# Patient Record
Sex: Male | Born: 2000
Health system: Southern US, Community
[De-identification: ages and names within clinical notes are randomized; demographics above are authoritative.]

## PROBLEM LIST (undated history)

## (undated) DIAGNOSIS — F419 Anxiety disorder, unspecified: Secondary | ICD-10-CM

## (undated) DIAGNOSIS — F429 Obsessive-compulsive disorder, unspecified: Secondary | ICD-10-CM

## (undated) DIAGNOSIS — E039 Hypothyroidism, unspecified: Secondary | ICD-10-CM

## (undated) DIAGNOSIS — S62109A Fracture of unspecified carpal bone, unspecified wrist, initial encounter for closed fracture: Secondary | ICD-10-CM

## (undated) DIAGNOSIS — S0993XA Unspecified injury of face, initial encounter: Secondary | ICD-10-CM

## (undated) HISTORY — PX: CIRCUMCISION: SUR203

## (undated) HISTORY — PX: WISDOM TOOTH EXTRACTION: SHX21

---
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
## 2019-02-18 ENCOUNTER — Encounter (INDEPENDENT_AMBULATORY_CARE_PROVIDER_SITE_OTHER): Payer: Self-pay | Admitting: Family

## 2019-02-25 ENCOUNTER — Telehealth (INDEPENDENT_AMBULATORY_CARE_PROVIDER_SITE_OTHER): Payer: Self-pay | Admitting: Family

## 2019-02-25 ENCOUNTER — Other Ambulatory Visit (INDEPENDENT_AMBULATORY_CARE_PROVIDER_SITE_OTHER): Payer: Self-pay | Admitting: Family

## 2019-02-25 DIAGNOSIS — E063 Autoimmune thyroiditis: Secondary | ICD-10-CM

## 2019-02-25 NOTE — Telephone Encounter (Signed)
Who's calling (name and relationship to patient) : Consuella Lose (mom)  Best contact number: [phone]  Provider they see: Hermenia Bers   Reason for call:  Mom called in requesting labs be put in for Saleh prior to his appt with Spenser. Mom is also wondering if Spenser can order Domanique a COVID antibodies test (?)  Call ID:      PRESCRIPTION REFILL ONLY  Name of prescription:  Pharmacy:

## 2019-02-25 NOTE — Telephone Encounter (Signed)
LVM, Labs are in the portal.

## 2019-02-25 NOTE — Telephone Encounter (Signed)
Can u and will u order the antibody test?

## 2019-02-25 NOTE — Telephone Encounter (Signed)
Orders placed. COVID antibody test and TFTS included.

## 2019-03-19 IMAGING — DX DG BONE AGE
1 series · 1 of 1 positions shown · non-contrast
Comparison: None.

CLINICAL DATA: Delayed bone age.

EXAM:
BONE AGE DETERMINATION
TECHNIQUE: AP radiographs of the hand and wrist are correlated with the
developmental standards of Greulich and Pyle.

[dg bone age]
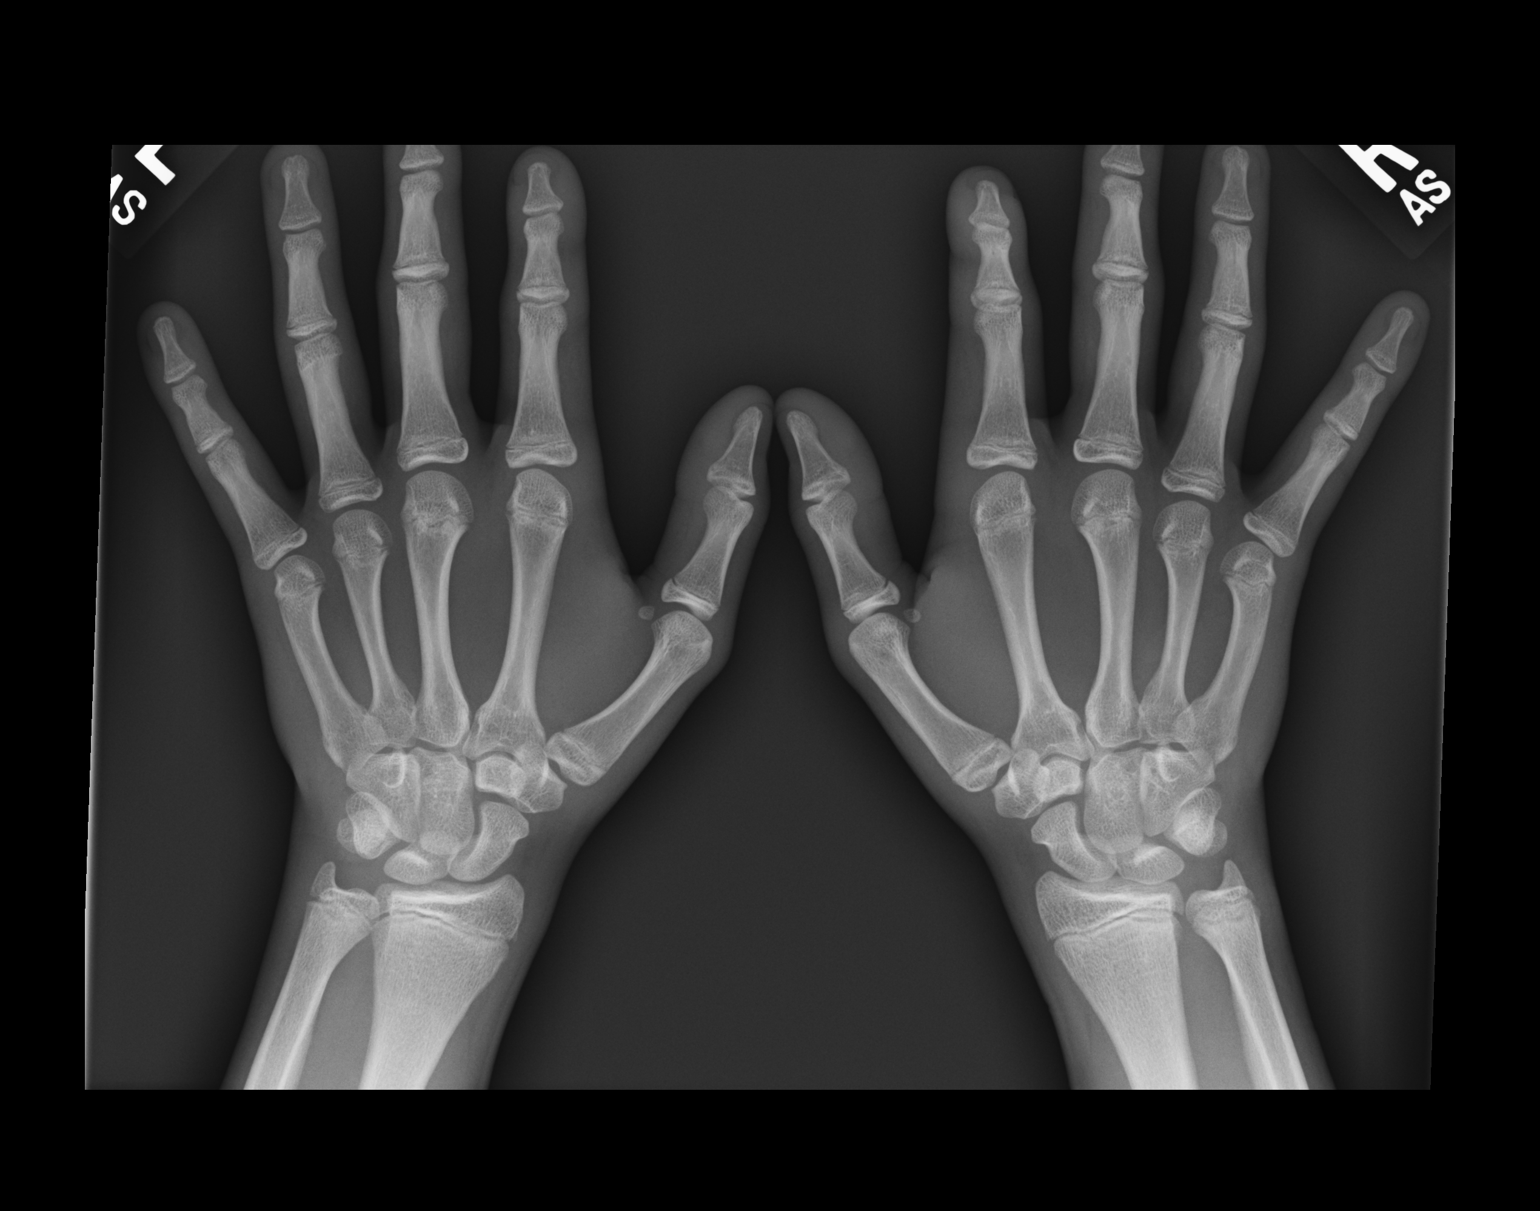

[1 of 1 positions shown; findings below may reference images not displayed]

FINDINGS: Chronologic age: 16 Years 2 months (date of birth 08/03/2000<Patient
Birth Date>08/03/2000)

Bone age:  14  Years 0 months.
IMPRESSION: Bone age is 13 years 0 months.

## 2019-03-27 DIAGNOSIS — E063 Autoimmune thyroiditis: Secondary | ICD-10-CM | POA: Diagnosis not present

## 2019-03-28 LAB — T4, FREE: Free T4: 1 ng/dL (ref 0.8–1.4)

## 2019-03-28 LAB — TSH: TSH: 1.24 mIU/L (ref 0.50–4.30)

## 2019-03-28 LAB — SAR COV2 SEROLOGY (COVID19)AB(IGG),IA: SARS CoV2 AB IGG: NEGATIVE

## 2019-03-31 ENCOUNTER — Telehealth (INDEPENDENT_AMBULATORY_CARE_PROVIDER_SITE_OTHER): Payer: Self-pay

## 2019-03-31 NOTE — Telephone Encounter (Signed)
-----   Message from Hermenia Bers, NP sent at 03/30/2019  8:23 AM EST ----- COVID antibody testing is negative. Thyroid labs are normal.

## 2019-03-31 NOTE — Telephone Encounter (Signed)
Called and relayed results. No questions were asked at this time.

## 2019-04-14 ENCOUNTER — Encounter (INDEPENDENT_AMBULATORY_CARE_PROVIDER_SITE_OTHER): Payer: Self-pay | Admitting: Family

## 2019-04-14 ENCOUNTER — Ambulatory Visit (INDEPENDENT_AMBULATORY_CARE_PROVIDER_SITE_OTHER): Payer: BC Managed Care – PPO | Admitting: Family

## 2019-04-14 ENCOUNTER — Other Ambulatory Visit: Payer: Self-pay

## 2019-04-14 VITALS — Wt 170.0 lb

## 2019-04-14 DIAGNOSIS — R6252 Short stature (child): Secondary | ICD-10-CM

## 2019-04-14 DIAGNOSIS — E063 Autoimmune thyroiditis: Secondary | ICD-10-CM

## 2019-04-14 NOTE — Patient Instructions (Signed)
Continue 37.5 mcg of levothyroxine  Repeat labs and follow up in 6 months.

## 2019-04-14 NOTE — Progress Notes (Signed)
This is a Pediatric Specialist E-Visit follow up consult provided via  WebEx Malaky Tetrault  consented to an E-Visit consult today.  Location of patient: Luis Robertson is at home Location of provider: Melissa Noon is at home Patient was referred by Monna Fam, MD   The following participants were involved in this E-Visit: Bethann Goo, RMA Hermenia Bers, Lawrence- Patient Chief Complain/ Reason for E-Visit today: Thyroid follow up Total time on call: This visit lasted >15 minutes. More then 50% of the visit was devoted to counseling.  Follow up: 6 months.      Subjective:  Subjective  Patient Name: Luis Robertson Date of Birth: 04-27-2000  MRN: [id number]  Manual Navarra  presents to the office today for initial evaluation and management of his short stature, delayed puberty, and hypothyroidism  HISTORY OF PRESENT ILLNESS:   Luis Robertson is a 18 y.o. Caucasian male   [name] was accompanied by his mother  1. [name] was seen by his PCP in May 2015 for his 18 year Luis Robertson. At that visit they discussed his short stature, poor linear growth, and delayed puberty start. He had a bone age which was read as 10 year at CA 18 years 2 months (film not available for review). He had labs which reportedly revealed hypothyroidism (no labs from PCP) and he was started on low dose Synthroid. He was referred to endocrinology for further evaluation and management.    2. Luis Robertson's last clinic visit was 09/2018. In the interim he has been generally healthy.  He has been staying active working out and cycling. 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 He is taking 37.5 mcg of levothyroxine per day. Denies missed doses. Takes at night usually. Denies fatigue, constipation and cold intolerance.   3. Pertinent Review of Systems:  All systems reviewed with pertinent positives listed below; otherwise negative. Constitutional:  Sleeping well. Energy is good.  HEENT: No vision changes. No neck pain. No trouble swallowing.  Respiratory: No increased work of breathing currently Cardiac. No tachycardia. No palpitations.  GI: No constipation or diarrhea GU: puberty changes as above Musculoskeletal: No joint deformity Neuro: Normal affect. No tremors.  Endocrine: As above   PAST MEDICAL, FAMILY, AND SOCIAL HISTORY  Past Medical History:  Diagnosis Date  . Anxiety   . Fracture of wrist 04/2014   left wrist at growth plate; is in cast 06/04/2014  . Hypothyroidism   . Lip injury 05/2014   upper lip; was hit in mouth by hockey stick  . OCD (obsessive compulsive disorder)     Family History  Problem Relation Age of Onset  . Hypertension Father      Current Outpatient Medications:  .  levothyroxine (SYNTHROID, LEVOTHROID) 75 MCG tablet, Take 0.5 tablets (37.5 mcg total) by mouth daily., Disp: 45 tablet, Rfl: 4 .  sertraline (ZOLOFT) 100 MG tablet, Take 200 mg by mouth daily. One two times a day, Disp: , Rfl:  .  naproxen sodium (ANAPROX) 220 MG tablet, Take 220 mg by mouth 2 (two) times daily with a meal. Reported on 10/18/2015, Disp: , Rfl:   Allergies as of 04/14/2019  . (No Known Allergies)     reports that he has never smoked. He has never used smokeless tobacco. He reports that he does not drink alcohol or use drugs. Pediatric History  Patient Parents  . Ashwath, Lasch (Mother)  . Hulsebus,[name] (Father)   Other Topics Concern  .  Not on file  Social History Narrative  . Not on file    1. School and Family: Freshman at [employer]  2. Activities: Cross Country/Track 3. Primary Care Provider: Aggie Hacker, MD  ROS: There are no other significant problems involving Luis Robertson's other body systems.    Objective:  Objective  Vital Signs:  Wt 170 lb (77.1 kg)   BMI 28.09 kg/m   Blood pressure percentiles are not available for patients who are 18 years or older.  Ht Readings from Last 3 Encounters:   10/13/18 5' 5.24" (1.657 m) (6 %, Z= -1.53)*  04/15/18 5' 5.28" (1.658 m) (7 %, Z= -1.49)*  09/10/17 5' 4.76" (1.645 m) (5 %, Z= -1.62)*   * Growth percentiles are based on CDC (Boys, 2-20 Years) data.   Wt Readings from Last 3 Encounters:  04/14/19 170 lb (77.1 kg) (71 %, Z= 0.56)*  10/13/18 167 lb 6.4 oz (75.9 kg) (70 %, Z= 0.53)*  04/15/18 153 lb (69.4 kg) (53 %, Z= 0.07)*   * Growth percentiles are based on CDC (Boys, 2-20 Years) data.   HC Readings from Last 3 Encounters:  No data found for [facility]   Body surface area is 1.88 meters squared. No height on file for this encounter. 71 %ile (Z= 0.56) based on CDC (Boys, 2-20 Years) weight-for-age data using vitals from 04/14/2019.    PHYSICAL EXAM:  General: Well developed, well nourished male in no acute distress.  Alert and oriented.  Head: Normocephalic, atraumatic.   Eyes:  Pupils equal and round. EOMI.  Sclera white.  No eye drainage.   Ears/Nose/Mouth/Throat: Nares patent, no nasal drainage.  Normal dentition, mucous membranes moist.  Neck: supple,  no thyromegaly Cardiovascular:No cyanosis.  Respiratory: No increased work of breathing.   Skin: warm, dry.  No rash or lesions. Neurologic: alert and oriented, normal speech, no tremor    Results for orders placed or performed in visit on 02/25/19  TSH  Result Value Ref Range   TSH 1.24 0.50 - 4.30 mIU/L  T4, free  Result Value Ref Range   Free T4 1.0 0.8 - 1.4 ng/dL  SAR CoV2 Serology (COVID 19)AB(IGG)IA  Result Value Ref Range   SARS CoV2 AB IGG NEGATIVE           Assessment and Plan:  Assessment  ASSESSMENT:   1. Short stature- Normal growth per report. Has completed puberty and is now at adult height.  2. Thyroid- He is euthyroid on 37.5 mcg of levothyroxine per day.    PLAN:  1. Diagnostic:TFTs as above. REpeat prior to next visit.  2. Therapeutic: 37.5 mcg of levothyroxine per day.  3. Patient education:Reviewed growth chart. Discussed s/s of  hypothyroidism. Encouraged to take medication every morning on empty stomach. Answered questions.  4. Follow-up: 6 months      Gretchen Short,  [facility]  Pediatric Specialist  [address]  [place], [address]  Tele: [phone]

## 2019-04-15 ENCOUNTER — Ambulatory Visit (INDEPENDENT_AMBULATORY_CARE_PROVIDER_SITE_OTHER): Payer: BC Managed Care – PPO | Admitting: Family

## 2019-04-27 ENCOUNTER — Other Ambulatory Visit (INDEPENDENT_AMBULATORY_CARE_PROVIDER_SITE_OTHER): Payer: Self-pay | Admitting: Family

## 2019-04-27 DIAGNOSIS — E063 Autoimmune thyroiditis: Secondary | ICD-10-CM

## 2019-09-17 ENCOUNTER — Other Ambulatory Visit (INDEPENDENT_AMBULATORY_CARE_PROVIDER_SITE_OTHER): Payer: Self-pay

## 2019-09-17 ENCOUNTER — Telehealth (INDEPENDENT_AMBULATORY_CARE_PROVIDER_SITE_OTHER): Payer: Self-pay | Admitting: Family

## 2019-09-17 DIAGNOSIS — E063 Autoimmune thyroiditis: Secondary | ICD-10-CM

## 2019-09-17 NOTE — Telephone Encounter (Signed)
  Who's calling (name and relationship to patient) : Erskine Squibb ( Mom)  Best contact number: [phone]  Provider they see: Gretchen Short  Reason for call: Patient is currently in [place]  mom wanted confirmation that it would be ok for patient to go to the [facility] there in [place] to have bloodwork done. They have been to the [facility] here a number of times for bloodwork. If so they would need the orders to be sent over for that testing to be done. She also wanted confirmation that a virtual appointment for the scheduled appointment on  10-15-19 was still going to be ok? Please call to confirm these two things. Thank You     PRESCRIPTION REFILL ONLY  Name of prescription:  Pharmacy:

## 2019-09-17 NOTE — Telephone Encounter (Signed)
Called mom. Left HIPPA compliant message in regards to virtual visit and lab orders being put in. Labs are in and [facility] should be able to pull them up no problem and a virtual should be ok.

## 2019-10-09 ENCOUNTER — Other Ambulatory Visit (INDEPENDENT_AMBULATORY_CARE_PROVIDER_SITE_OTHER): Payer: Self-pay

## 2019-10-09 DIAGNOSIS — E063 Autoimmune thyroiditis: Secondary | ICD-10-CM

## 2019-10-09 DIAGNOSIS — E039 Hypothyroidism, unspecified: Secondary | ICD-10-CM | POA: Insufficient documentation

## 2019-10-09 DIAGNOSIS — F429 Obsessive-compulsive disorder, unspecified: Secondary | ICD-10-CM | POA: Insufficient documentation

## 2019-10-09 DIAGNOSIS — R070 Pain in throat: Secondary | ICD-10-CM | POA: Diagnosis not present

## 2019-10-10 LAB — T4, FREE: Free T4: 1.14 ng/dL (ref 0.82–1.77)

## 2019-10-10 LAB — TSH: TSH: 0.93 u[IU]/mL (ref 0.450–4.500)

## 2019-10-12 ENCOUNTER — Encounter (INDEPENDENT_AMBULATORY_CARE_PROVIDER_SITE_OTHER): Payer: Self-pay

## 2019-10-15 ENCOUNTER — Telehealth (INDEPENDENT_AMBULATORY_CARE_PROVIDER_SITE_OTHER): Payer: BC Managed Care – PPO | Admitting: Family

## 2019-10-15 ENCOUNTER — Other Ambulatory Visit: Payer: Self-pay

## 2019-10-15 ENCOUNTER — Telehealth (INDEPENDENT_AMBULATORY_CARE_PROVIDER_SITE_OTHER): Payer: Self-pay | Admitting: Family

## 2019-10-15 ENCOUNTER — Encounter (INDEPENDENT_AMBULATORY_CARE_PROVIDER_SITE_OTHER): Payer: Self-pay | Admitting: Family

## 2019-10-15 DIAGNOSIS — E063 Autoimmune thyroiditis: Secondary | ICD-10-CM

## 2019-10-15 MED ORDER — LEVOTHYROXINE SODIUM 75 MCG PO TABS: 37.5000 ug | ORAL_TABLET | Freq: Every day | ORAL | 1 refills | Status: DC

## 2019-10-15 NOTE — Telephone Encounter (Signed)
°  Who's calling (name and relationship to patient) : Erskine Squibb (mom)  Best contact number: [phone]  Provider they see: Gretchen Short  Reason for call: Requests refill of thyroid medicine be sent to pharmacy.    PRESCRIPTION REFILL ONLY  Name of prescription: levothyroxine (SYNTHROID) 75 MCG tablet  [id number] [place], [place] - [address]

## 2019-10-15 NOTE — Patient Instructions (Signed)
Continue 37.5 mcg of levothyroxine per day  Labs and follow up in 6 months.

## 2019-10-15 NOTE — Progress Notes (Signed)
This is a Pediatric Specialist E-Visit follow up consult provided via WebEx Dakari Cregger  consented to an E-Visit consult today.  Location of patient: Aquan is at [employer] of provider: Melissa Noon  is at [facility] office.  (location) Patient was referred by Monna Fam, MD   The following participants were involved in this E-Visit: Luis Robertson   Chief Complain/ Reason for E-Visit today: Hypothyroidism  LOS: >20 spent today reviewing the medical chart, counseling the patient/family, and documenting today's visit.   Follow up: 6 months.      Subjective:  Subjective  Patient Name: Luis Robertson Date of Birth: 2000/12/01  MRN: [id number]  Luis Robertson  presents to the office today for initial evaluation and management of his short stature, delayed puberty, and hypothyroidism  HISTORY OF PRESENT ILLNESS:   Luis Robertson is a 19 y.o. Caucasian male   Kalief was accompanied by his mother  1. Luis Robertson was seen by his PCP in May 2015 for his 13 year [place]. At that visit they discussed his short stature, poor linear growth, and delayed puberty start. He had a bone age which was read as 10 year at [place] 13 years 2 months (film not available for review). He had labs which reportedly revealed hypothyroidism (no labs from PCP) and he was started on low dose Synthroid. He was referred to endocrinology for further evaluation and management.    2. Luis Robertson's last clinic visit was 03/2019. In the interim he has been generally healthy.  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 He feels like things have been going well overall. He is taking 37.5 mcg of levothyroxine per day, takes a night. He denies fatigue, constipation and cold intolerance.    3. Pertinent Review of Systems:  All systems reviewed with pertinent positives listed below; otherwise negative. Constitutional: Sleeping well. Energy is good.  HEENT: No vision changes. No neck  pain. No trouble swallowing.  Respiratory: No increased work of breathing currently Cardiac. No tachycardia. No palpitations.  GI: No constipation or diarrhea GU: puberty changes as above Musculoskeletal: No joint deformity Neuro: Normal affect. No tremors.  Endocrine: As above   PAST MEDICAL, FAMILY, AND SOCIAL HISTORY  Past Medical History:  Diagnosis Date  . Anxiety   . Fracture of wrist 04/2014   left wrist at growth plate; is in cast 06/04/2014  . Hypothyroidism   . Lip injury 05/2014   upper lip; was hit in mouth by hockey stick  . OCD (obsessive compulsive disorder)     Family History  Problem Relation Age of Onset  . Hypertension Father      Current Outpatient Medications:  .  levothyroxine (SYNTHROID) 75 MCG tablet, TAKE 0.5 TABLETS (37.5 MCG TOTAL) BY MOUTH DAILY., Disp: 45 tablet, Rfl: 1 .  naproxen sodium (ANAPROX) 220 MG tablet, Take 220 mg by mouth 2 (two) times daily with a meal. Reported on 10/18/2015, Disp: , Rfl:  .  sertraline (ZOLOFT) 100 MG tablet, Take 200 mg by mouth daily. One two times a day, Disp: , Rfl:   Allergies as of 10/15/2019  . (No Known Allergies)     reports that he has never smoked. He has never used smokeless tobacco. He reports that he does not drink alcohol and does not use drugs. Pediatric History  Patient Parents  . Karee, Christopherson (Mother)  . Manger,[name] (Father)   Other Topics Concern  . Not on file  Social History Narrative  . 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  2. Activities: Cross Country/Track 3. Primary Care Provider: Aggie Hacker, MD  ROS: There are no other significant problems involving Miles's other body systems.    Objective:  Objective  Vital Signs:  There were no vitals taken for this visit.  Growth percentile SmartLinks can only be used for patients less than 54 years old.  Ht Readings from Last 3 Encounters:  10/13/18 5' 5.24" (1.657 m) (6 %, Z= -1.53)*  04/15/18 5' 5.28"  (1.658 m) (7 %, Z= -1.49)*  09/10/17 5' 4.76" (1.645 m) (5 %, Z= -1.62)*   * Growth percentiles are based on CDC (Boys, 2-20 Years) data.   Wt Readings from Last 3 Encounters:  04/14/19 170 lb (77.1 kg) (71 %, Z= 0.56)*  10/13/18 167 lb 6.4 oz (75.9 kg) (70 %, Z= 0.53)*  04/15/18 153 lb (69.4 kg) (53 %, Z= 0.07)*   * Growth percentiles are based on CDC (Boys, 2-20 Years) data.   HC Readings from Last 3 Encounters:  No data found for [facility]   There is no height or weight on file to calculate BSA. Facility age limit for growth percentiles is 20 years. Facility age limit for growth percentiles is 20 years.    PHYSICAL EXAM:  General: Well developed, well nourished male in no acute distress.   Head: Normocephalic, atraumatic.   Eyes:  Pupils equal and round. EOMI.  Sclera white.  No eye drainage.   Ears/Nose/Mouth/Throat: Nares patent, no nasal drainage.  Normal dentition, mucous membranes moist.  Neck: supple, Cardiovascular: No cyanosis.  Respiratory: No increased work of breathing. Skin: warm, dry.  No rash or lesions. Neurologic: alert and oriented, normal speech, no tremor     Results for orders placed or performed in visit on 10/09/19  TSH  Result Value Ref Range   TSH 0.930 0.450 - 4.500 uIU/mL  T4, free  Result Value Ref Range   Free T4 1.14 0.82 - 1.77 ng/dL          Assessment and Plan:  Assessment  ASSESSMENT:   1. Short stature- Normal growth per report. Has completed puberty and is now at adult height.  2. Thyroid- He is clinically and biochemically euthyroid on 37.5 mcg of levothyroxine per day.    PLAN:  1. Diagnostic:TSH, FT4 and T4 next visit. REviewed labs.  2. Therapeutic: 37.5 mcg of levothyroxine per day  3. Patient education: Discussed labs with patient. Encouraged to take medication every morning on empty stomach. Advised s/s of hypothyroidism. Answered questions.  4. Follow-up: 6 months      Gretchen Short,  [facility]  Pediatric  Specialist  [address]  [place], [address]  Tele: [phone]

## 2019-10-26 DIAGNOSIS — F339 Major depressive disorder, recurrent, unspecified: Secondary | ICD-10-CM | POA: Diagnosis not present

## 2019-10-28 DIAGNOSIS — J029 Acute pharyngitis, unspecified: Secondary | ICD-10-CM | POA: Diagnosis not present

## 2020-03-31 ENCOUNTER — Other Ambulatory Visit (INDEPENDENT_AMBULATORY_CARE_PROVIDER_SITE_OTHER): Payer: Self-pay

## 2020-03-31 ENCOUNTER — Telehealth (INDEPENDENT_AMBULATORY_CARE_PROVIDER_SITE_OTHER): Payer: Self-pay | Admitting: Family

## 2020-03-31 DIAGNOSIS — E063 Autoimmune thyroiditis: Secondary | ICD-10-CM

## 2020-03-31 NOTE — Telephone Encounter (Signed)
  Who's calling (name and relationship to patient) :Mom/ Arlan Organ  Best contact number:[phone]  Provider they QMG:NOIBBCW [name]   Reason for call:Needs Lab orders sent to [facility] in [place], [place] Today if possible      PRESCRIPTION REFILL ONLY  Name of prescription:  Pharmacy:

## 2020-04-05 ENCOUNTER — Other Ambulatory Visit (INDEPENDENT_AMBULATORY_CARE_PROVIDER_SITE_OTHER): Payer: Self-pay | Admitting: Family

## 2020-04-05 DIAGNOSIS — E063 Autoimmune thyroiditis: Secondary | ICD-10-CM

## 2020-04-13 ENCOUNTER — Telehealth (INDEPENDENT_AMBULATORY_CARE_PROVIDER_SITE_OTHER): Payer: BC Managed Care – PPO | Admitting: Family

## 2020-05-20 LAB — TSH: TSH: 1.03 u[IU]/mL (ref 0.450–4.500)

## 2020-05-20 LAB — T4, FREE: Free T4: 1.25 ng/dL (ref 0.82–1.77)

## 2020-05-25 ENCOUNTER — Telehealth (INDEPENDENT_AMBULATORY_CARE_PROVIDER_SITE_OTHER): Payer: 59 | Admitting: Family

## 2020-05-25 ENCOUNTER — Encounter (INDEPENDENT_AMBULATORY_CARE_PROVIDER_SITE_OTHER): Payer: Self-pay | Admitting: Family

## 2020-05-25 ENCOUNTER — Other Ambulatory Visit: Payer: Self-pay

## 2020-05-25 ENCOUNTER — Other Ambulatory Visit (INDEPENDENT_AMBULATORY_CARE_PROVIDER_SITE_OTHER): Payer: Self-pay

## 2020-05-25 DIAGNOSIS — E063 Autoimmune thyroiditis: Secondary | ICD-10-CM

## 2020-05-25 MED ORDER — LEVOTHYROXINE SODIUM 75 MCG PO TABS
37.5000 ug | ORAL_TABLET | Freq: Every day | ORAL | 1 refills | Status: DC
Start: 2020-05-25 — End: 2021-10-12

## 2020-05-25 NOTE — Progress Notes (Signed)
This is a Pediatric Specialist E-Visit follow up consult provided via  MyChart video visit  Luis Robertson consented to an E-Visit consult today.  Location of patient: Luis Robertson is at college apartment (location) Location of provider: Gretchen Short FNP [facility] office Patient was referred by Aggie Hacker, MD   The following participants were involved in this E-Visit: Gretchen Short FNP- Mora Bellman CCMA- Earlie Raveling- Patient  Chief Complain/ Reason for E-Visit today: Hypothyroidism  Total time on call: >20  spent today reviewing the medical chart, counseling the patient/family, and documenting today's visit.   Follow up: 6 months       Subjective:  Subjective  Patient Name: Luis Robertson Date of Birth: [birth year]  MRN: [id number]  Luis Robertson  presents to the office today for initial evaluation and management of his short stature, delayed puberty, and hypothyroidism  HISTORY OF PRESENT ILLNESS:   Luis Robertson is a 20 y.o. Caucasian male   Luis Robertson was accompanied by his mother  1. Luis Robertson was seen by his PCP in May 2015 for his 13 year [place]. At that visit they discussed his short stature, poor linear growth, and delayed puberty start. He had a bone age which was read as 9 year at [place] 13 years 2 months (film not available for review). He had labs which reportedly revealed hypothyroidism (no labs from PCP) and he was started on low dose Synthroid. He was referred to endocrinology for further evaluation and management.    2. Luis Robertson's last clinic visit was 09/2019. In the interim he has been generally healthy.  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 He hopes to graduate in the next year. He is taking 37.5 mcg of levothyroxine per day, takes at night and has not missed many doses. Denies fatigue, constipation and cold intolerance.    3. Pertinent Review of Systems:  All systems reviewed with pertinent positives listed below; otherwise  negative. Constitutional: Sleeping well. Reports weight is stable.  HEENT: No vision changes. No neck pain. No trouble swallowing.  Respiratory: No increased work of breathing currently Cardiac. No tachycardia. No palpitations.  GI: No constipation or diarrhea GU: puberty changes as above Musculoskeletal: No joint deformity Neuro: Normal affect. No tremors.  Endocrine: As above   PAST MEDICAL, FAMILY, AND SOCIAL HISTORY  Past Medical History:  Diagnosis Date  . Anxiety   . Fracture of wrist 04/2014   left wrist at growth plate; is in cast 06/04/2014  . Hypothyroidism   . Lip injury 05/2014   upper lip; was hit in mouth by hockey stick  . OCD (obsessive compulsive disorder)     Family History  Problem Relation Age of Onset  . Hypertension Father      Current Outpatient Medications:  .  levothyroxine (SYNTHROID) 75 MCG tablet, TAKE 0.5 TABLETS (37.5 MCG TOTAL) BY MOUTH DAILY., Disp: 45 tablet, Rfl: 1 .  naproxen sodium (ANAPROX) 220 MG tablet, Take 220 mg by mouth 2 (two) times daily with a meal. Reported on 10/18/2015, Disp: , Rfl:  .  sertraline (ZOLOFT) 100 MG tablet, Take 200 mg by mouth daily. One two times a day, Disp: , Rfl:   Allergies as of 05/25/2020  . (No Known Allergies)     reports that he has never smoked. He has never used smokeless tobacco. He reports that he does not drink alcohol and does not use drugs. Pediatric History  Patient Parents  . Everrett, Lacasse (Mother)  . Arquette,[name] (Father)   Other Topics  Concern  . Not on file  Social History Narrative  . 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  2. Activities: Cross Country/Track 3. Primary Care Provider: Aggie Hacker, MD  ROS: There are no other significant problems involving Luis Robertson's other body systems.    Objective:  Objective  Vital Signs:  There were no vitals taken for this visit.  Growth percentile SmartLinks can only be used for patients less than 21 years  old.  Ht Readings from Last 3 Encounters:  10/13/18 5' 5.24" (1.657 m) (6 %, Z= -1.53)*  04/15/18 5' 5.28" (1.658 m) (7 %, Z= -1.49)*  09/10/17 5' 4.76" (1.645 m) (5 %, Z= -1.62)*   * Growth percentiles are based on CDC (Boys, 2-20 Years) data.   Wt Readings from Last 3 Encounters:  04/14/19 170 lb (77.1 kg) (71 %, Z= 0.56)*  10/13/18 167 lb 6.4 oz (75.9 kg) (70 %, Z= 0.53)*  04/15/18 153 lb (69.4 kg) (53 %, Z= 0.07)*   * Growth percentiles are based on CDC (Boys, 2-20 Years) data.   HC Readings from Last 3 Encounters:  No data found for Luis Robertson   There is no height or weight on file to calculate BSA. Facility age limit for growth percentiles is 20 years. Facility age limit for growth percentiles is 20 years.    PHYSICAL EXAM:  General: Well developed, well nourished male in no acute distress.  Appears  stated age Head: Normocephalic, atraumatic.   Eyes:  Pupils equal and round. EOMI.  Sclera white.  No eye drainage.   Ears/Nose/Mouth/Throat:  Normal dentition, mucous membranes moist.  Cardiovascular: no cyanosis.  Respiratory: No increased work of breathing.   Skin: warm, dry.  No rash or lesions. Neurologic: alert and oriented, normal speech, no tremor   Results for orders placed or performed in visit on 03/31/20  TSH  Result Value Ref Range   TSH 1.030 0.450 - 4.500 uIU/mL  T4, free  Result Value Ref Range   Free T4 1.25 0.82 - 1.77 ng/dL          Assessment and Plan:  Assessment  ASSESSMENT:   1. Short stature- Normal growth per report. Has completed puberty and is now at adult height.  2. Thyroid- He is clinically and biochemically euthyroid on 37.5 mcg of levothyroxine per day    PLAN:  1. Diagnostic:Reviewed labs. Will repeat TSH, T4 and FT4 at next visit. .  2. Therapeutic: 37.5 mcg of levothyroxine per day  3. Patient education: Discussed all of the above in detail and answered questions.  4. Follow-up: 6 months      Gretchen Short,  [facility]   Pediatric Specialist  [address]  [place], [address]  Tele: [phone]

## 2021-03-09 ENCOUNTER — Other Ambulatory Visit (INDEPENDENT_AMBULATORY_CARE_PROVIDER_SITE_OTHER): Payer: Self-pay | Admitting: Family

## 2021-03-09 DIAGNOSIS — E063 Autoimmune thyroiditis: Secondary | ICD-10-CM

## 2021-04-26 DIAGNOSIS — F429 Obsessive-compulsive disorder, unspecified: Secondary | ICD-10-CM | POA: Diagnosis not present

## 2021-05-05 DIAGNOSIS — F429 Obsessive-compulsive disorder, unspecified: Secondary | ICD-10-CM | POA: Diagnosis not present

## 2021-05-10 DIAGNOSIS — F429 Obsessive-compulsive disorder, unspecified: Secondary | ICD-10-CM | POA: Diagnosis not present

## 2021-05-17 DIAGNOSIS — F429 Obsessive-compulsive disorder, unspecified: Secondary | ICD-10-CM | POA: Diagnosis not present

## 2021-05-24 DIAGNOSIS — F429 Obsessive-compulsive disorder, unspecified: Secondary | ICD-10-CM | POA: Diagnosis not present

## 2021-06-14 DIAGNOSIS — F429 Obsessive-compulsive disorder, unspecified: Secondary | ICD-10-CM | POA: Diagnosis not present

## 2021-06-28 DIAGNOSIS — F429 Obsessive-compulsive disorder, unspecified: Secondary | ICD-10-CM | POA: Diagnosis not present

## 2021-07-12 DIAGNOSIS — F429 Obsessive-compulsive disorder, unspecified: Secondary | ICD-10-CM | POA: Diagnosis not present

## 2021-07-26 DIAGNOSIS — F429 Obsessive-compulsive disorder, unspecified: Secondary | ICD-10-CM | POA: Diagnosis not present

## 2021-08-09 DIAGNOSIS — F429 Obsessive-compulsive disorder, unspecified: Secondary | ICD-10-CM | POA: Diagnosis not present

## 2021-08-17 DIAGNOSIS — F429 Obsessive-compulsive disorder, unspecified: Secondary | ICD-10-CM | POA: Diagnosis not present

## 2021-08-23 DIAGNOSIS — F429 Obsessive-compulsive disorder, unspecified: Secondary | ICD-10-CM | POA: Diagnosis not present

## 2021-09-06 DIAGNOSIS — F429 Obsessive-compulsive disorder, unspecified: Secondary | ICD-10-CM | POA: Diagnosis not present

## 2021-09-22 DIAGNOSIS — F429 Obsessive-compulsive disorder, unspecified: Secondary | ICD-10-CM | POA: Diagnosis not present

## 2021-10-04 DIAGNOSIS — F429 Obsessive-compulsive disorder, unspecified: Secondary | ICD-10-CM | POA: Diagnosis not present

## 2021-10-11 DIAGNOSIS — F429 Obsessive-compulsive disorder, unspecified: Secondary | ICD-10-CM | POA: Diagnosis not present

## 2021-10-12 ENCOUNTER — Telehealth (INDEPENDENT_AMBULATORY_CARE_PROVIDER_SITE_OTHER): Payer: Self-pay | Admitting: Family

## 2021-10-12 DIAGNOSIS — E063 Autoimmune thyroiditis: Secondary | ICD-10-CM

## 2021-10-12 MED ORDER — LEVOTHYROXINE SODIUM 75 MCG PO TABS: 37.5000 ug | ORAL_TABLET | Freq: Every day | ORAL | 0 refills | Status: DC

## 2021-10-12 NOTE — Telephone Encounter (Signed)
  Name of who is calling: patient   Caller's Relationship to Patient: mom    Best contact number:[phone]   Provider they FQM:[id number]   Reason for call:medication refill       PRESCRIPTION REFILL ONLY  Name of prescription:levothyroxine (SYNTHROID) 75 MCG tablet  [facility] [place] [facility]  [address] Merrimon Seltzer -  [phone]

## 2021-10-17 ENCOUNTER — Telehealth (INDEPENDENT_AMBULATORY_CARE_PROVIDER_SITE_OTHER): Payer: Self-pay | Admitting: Family

## 2021-10-17 DIAGNOSIS — E063 Autoimmune thyroiditis: Secondary | ICD-10-CM

## 2021-10-18 DIAGNOSIS — F429 Obsessive-compulsive disorder, unspecified: Secondary | ICD-10-CM | POA: Diagnosis not present

## 2021-10-18 MED ORDER — LEVOTHYROXINE SODIUM 75 MCG PO TABS: 37.5000 ug | ORAL_TABLET | Freq: Every day | ORAL | 0 refills | Status: DC

## 2021-11-01 DIAGNOSIS — F429 Obsessive-compulsive disorder, unspecified: Secondary | ICD-10-CM | POA: Diagnosis not present

## 2021-11-08 DIAGNOSIS — F429 Obsessive-compulsive disorder, unspecified: Secondary | ICD-10-CM | POA: Diagnosis not present

## 2021-11-10 ENCOUNTER — Other Ambulatory Visit (INDEPENDENT_AMBULATORY_CARE_PROVIDER_SITE_OTHER): Payer: Self-pay | Admitting: Family

## 2021-11-10 DIAGNOSIS — E063 Autoimmune thyroiditis: Secondary | ICD-10-CM

## 2021-11-17 ENCOUNTER — Telehealth (INDEPENDENT_AMBULATORY_CARE_PROVIDER_SITE_OTHER): Payer: Self-pay | Admitting: Family

## 2021-11-17 DIAGNOSIS — E063 Autoimmune thyroiditis: Secondary | ICD-10-CM

## 2021-11-17 MED ORDER — LEVOTHYROXINE SODIUM 75 MCG PO TABS: 37.5000 ug | ORAL_TABLET | Freq: Every day | ORAL | 0 refills | Status: DC

## 2021-11-17 NOTE — Telephone Encounter (Signed)
Ok to send 30 days worth if that has not already been done?

## 2021-11-17 NOTE — Telephone Encounter (Signed)
Who's calling (name and relationship to patient) : Luis Robertson  Best contact number: [phone]  Provider they see: Gretchen Short  Reason for call: Has three more doses   Call ID:      PRESCRIPTION REFILL ONLY  Name of prescription: Levothyroxine  [facility] [place]

## 2021-11-17 NOTE — Telephone Encounter (Signed)
Was he not able to get an appointment within 30 days of 6/23? He is an adult and thyroid medication is not life sustaining. If there were no appointments available sooner then ok to write. If he no-showed an appointment in the past 30 days then he can wait until his appointment.

## 2021-11-22 DIAGNOSIS — F429 Obsessive-compulsive disorder, unspecified: Secondary | ICD-10-CM | POA: Diagnosis not present

## 2021-11-23 ENCOUNTER — Telehealth (INDEPENDENT_AMBULATORY_CARE_PROVIDER_SITE_OTHER): Payer: 59 | Admitting: Family

## 2021-11-23 NOTE — Progress Notes (Deleted)
This is a Pediatric Specialist E-Visit follow up consult provided via  MyChart video visit  Luis Robertson consented to an E-Visit consult today.  Location of patient: Luis Robertson is at college apartment (location) Location of provider: Gretchen Short FNP PS office Patient was referred by Aggie Hacker, MD   The following participants were involved in this E-Visit: Gretchen Short FNP- Mora Bellman CCMA- Earlie Raveling- Patient  Chief Complain/ Reason for E-Visit today: Hypothyroidism  Total time on call: >20  spent today reviewing the medical chart, counseling the patient/family, and documenting today's visit.   Follow up: 6 months       Subjective:  Subjective  Patient Name: Luis Robertson Date of Birth: [birth year]  MRN: [id number]  Luis Robertson  presents to the office today for initial evaluation and management of his short stature, delayed puberty, and hypothyroidism  HISTORY OF PRESENT ILLNESS:   Luis Robertson is a 21 y.o. Caucasian male   Luis Robertson was accompanied by his mother  1. Luis Robertson was seen by his PCP in May 2015 for his 13 year WCC. At that visit they discussed his short stature, poor linear growth, and delayed puberty start. He had a bone age which was read as 10 year at [place] 13 years 2 months (film not available for review). He had labs which reportedly revealed hypothyroidism (no labs from PCP) and he was started on low dose Synthroid. He was referred to endocrinology for further evaluation and management.    2. Luis Robertson's last clinic visit was 05/2020. In the interim he has been generally healthy.  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 He hopes to graduate in the next year. He is taking 37.5 mcg of levothyroxine per day, takes at night and has not missed many doses. Denies fatigue, constipation and cold intolerance.    3. Pertinent Review of Systems:  All systems reviewed with pertinent positives listed below; otherwise  negative. Constitutional: Sleeping well. Reports weight is stable.  HEENT: No vision changes. No neck pain. No trouble swallowing.  Respiratory: No increased work of breathing currently Cardiac. No tachycardia. No palpitations.  GI: No constipation or diarrhea GU: puberty changes as above Musculoskeletal: No joint deformity Neuro: Normal affect. No tremors.  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 Reported on 10/18/2015, Disp: , Rfl:    sertraline (ZOLOFT) 100 MG tablet, Take 200 mg by mouth daily. One two times a day, Disp: , Rfl:   Allergies as of 11/23/2021   (No Known Allergies)     reports that he has never smoked. He has never used smokeless tobacco. He reports that he does not drink alcohol and does not use drugs. 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  2. Activities: Cross Country/Track 3. Primary Care Provider: Aggie Hacker, MD  ROS: There are no other significant problems involving Luis Robertson's other body systems.    Objective:  Objective  Vital Signs:  There were no vitals taken for this visit.  Growth %ile SmartLinks can only be used for patients less than 33 years old.  Ht Readings from  Last 3 Encounters:  10/13/18 5' 5.24" (1.657 m) (6 %, Z= -1.53)*  04/15/18 5' 5.28" (1.658 m) (7 %, Z= -1.49)*  09/10/17 5' 4.76" (1.645 m) (5 %, Z= -1.62)*   * Growth percentiles are based on CDC (Boys, 2-20 Years) data.   Wt Readings from Last 3 Encounters:  04/14/19 170 lb (77.1 kg) (71 %, Z= 0.56)*  10/13/18 167 lb 6.4 oz (75.9 kg) (70 %, Z= 0.53)*  04/15/18 153 lb (69.4 kg) (53 %, Z= 0.07)*   * Growth percentiles are based on CDC (Boys, 2-20 Years) data.   HC Readings from Last 3 Encounters:  No data found for [facility]   There is no height or weight on file to calculate BSA. Facility age limit for growth %iles is 20 years. Facility age limit for growth %iles is 20 years.    PHYSICAL EXAM:  General: Well developed, well nourished male in no acute distress.  Appears *** stated age Head: Normocephalic, atraumatic.   Eyes:  Pupils equal and round. EOMI.  Sclera white.  No eye drainage.   Cardiovascular: No cyanosis.  Respiratory: No increased WOB Skin: No rash or lesions. Neurologic: alert and oriented, normal speech, no tremor    Results for orders placed or performed in visit on 03/31/20  TSH  Result Value Ref Range   TSH 1.030 0.450 - 4.500 uIU/mL  T4, free  Result Value Ref Range   Free T4 1.25 0.82 - 1.77 ng/dL          Assessment and Plan:  Assessment  ASSESSMENT:   1. Short stature- Normal growth per report. Has completed puberty and is now at adult height.  2. Thyroid- He is clinically and biochemically euthyroid on 37.5 mcg of levothyroxine per day    PLAN:  1. Diagnostic:Reviewed labs. Will repeat TSH, T4 and FT4 at next visit. .  2. Therapeutic: 37.5 mcg of levothyroxine per day  3. Patient education: Discussed s/s of hypothyroidism and importance of consistent compliance with medication. Refer to adult endocrinology for further follow up.  4. Follow-up: None. Referral placed to adult endocrine. He will have 6 months of refills to give him time to  set up appt.      Gretchen Short,  FNP-C  Pediatric Specialist  [address]  [place], [address]  Tele: [phone]

## 2021-12-06 DIAGNOSIS — F429 Obsessive-compulsive disorder, unspecified: Secondary | ICD-10-CM | POA: Diagnosis not present

## 2021-12-12 ENCOUNTER — Other Ambulatory Visit (INDEPENDENT_AMBULATORY_CARE_PROVIDER_SITE_OTHER): Payer: Self-pay | Admitting: Family

## 2021-12-12 DIAGNOSIS — E063 Autoimmune thyroiditis: Secondary | ICD-10-CM

## 2021-12-13 DIAGNOSIS — F429 Obsessive-compulsive disorder, unspecified: Secondary | ICD-10-CM | POA: Diagnosis not present

## 2021-12-27 DIAGNOSIS — F429 Obsessive-compulsive disorder, unspecified: Secondary | ICD-10-CM | POA: Diagnosis not present

## 2021-12-28 ENCOUNTER — Encounter (INDEPENDENT_AMBULATORY_CARE_PROVIDER_SITE_OTHER): Payer: Self-pay | Admitting: Family

## 2021-12-28 ENCOUNTER — Telehealth (INDEPENDENT_AMBULATORY_CARE_PROVIDER_SITE_OTHER): Payer: BC Managed Care – PPO | Admitting: Family

## 2021-12-28 DIAGNOSIS — E063 Autoimmune thyroiditis: Secondary | ICD-10-CM

## 2021-12-28 DIAGNOSIS — E039 Hypothyroidism, unspecified: Secondary | ICD-10-CM | POA: Diagnosis not present

## 2021-12-28 MED ORDER — LEVOTHYROXINE SODIUM 75 MCG PO TABS: 37.5000 ug | ORAL_TABLET | Freq: Every day | ORAL | 6 refills | Status: AC

## 2021-12-28 NOTE — Patient Instructions (Signed)
-  Take your medication at the same time every day -Try to take it on an empty stomach -If you forget to take a dose, take it as soon as you remember.  If you don't remember until the next day, take 2 doses then.  NEVER take more than 2 doses at a time. -Use a pill box to help make it easier to keep track of doses   It was a pleasure seeing you in clinic today. Please do not hesitate to contact me if you have questions or concerns.   Please sign up for MyChart. This is a communication tool that allows you to send an email directly to me. This can be used for questions, prescriptions and blood sugar reports. We will also release labs to you with instructions on MyChart. Please do not use MyChart if you need immediate or emergency assistance. Ask our wonderful front office staff if you need assistance.   

## 2021-12-28 NOTE — Progress Notes (Signed)
This is a Pediatric Specialist E-Visit consult/follow up provided via My Chart Luis Robertson  consented to an E-Visit consult today.  Location of patient: [place] is at Home Location of provider: Gretchen Short  at [facility] office  Patient was referred by Aggie Hacker, MD   The following participants were involved in this E-Visit:Luis Robertson Luis Robertson, Provider- Mora Bellman- CCMA Luis Robertson Patient  This visit was done via VIDEO   Chief Complain/ Reason for E-Visit today: Hypothyroidism  Total time on call: >15 spent today reviewing the medical chart, counseling the patient/family, and documenting today's visit.   Follow up: None. Refer to adult endocrinology      Subjective:  Subjective  Patient Name: Luis Robertson Date of Birth: 07/16/2000  MRN: [id number]  Luis Robertson  presents to the office today for initial evaluation and management of his short stature, delayed puberty, and hypothyroidism  HISTORY OF PRESENT ILLNESS:   Luis Robertson is a 21 y.o. Caucasian male   Luis Robertson was accompanied by his mother  1. Luis Robertson was seen by his PCP in May 2015 for his 13 year WCC. At that visit they discussed his short stature, poor linear growth, and delayed puberty start. He had a bone age which was read as 10 year at [place] 13 years 2 months (film not available for review). He had labs which reportedly revealed hypothyroidism (no labs from PCP) and he was started on low dose Synthroid. He was referred to endocrinology for further evaluation and management.    2. Luis Robertson's last clinic visit was 05/2020. In the interim he has been generally healthy.  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 He is currently taking 37.5 mcg of levothyroxine every night. He rarely misses a dose. Denies fatigue, constipation and cold intolerance.    3. Pertinent Review of Systems:  All systems reviewed with pertinent positives listed below;  otherwise negative. Constitutional: Sleeping well. Reports weight is stable.  HEENT: No vision changes. No neck pain. No trouble swallowing.  Respiratory: No increased work of breathing currently Cardiac. No tachycardia. No palpitations.  GI: No constipation or diarrhea GU: puberty changes as above Musculoskeletal: No joint deformity Neuro: Normal affect. No tremors.  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 Reported on 10/18/2015, Disp: , Rfl:    sertraline (ZOLOFT) 100 MG tablet, Take 200 mg by mouth daily. One two times a day, Disp: , Rfl:   Allergies as of 12/28/2021   (No Known Allergies)     reports that he has never smoked. He has never used smokeless tobacco. He reports that he does not drink alcohol and does not use drugs. 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  2. Activities: Cross Country/Track 3. Primary Care Provider: Aggie Hacker, MD  ROS: There are no other significant problems involving Luis Robertson's other body systems.    Objective:  Objective  Vital Signs:  There were no vitals taken for this visit.  Growth %ile SmartLinks can only be used for patients less than 36 years old.  Ht  Readings from Last 3 Encounters:  10/13/18 5' 5.24" (1.657 m) (6 %, Z= -1.53)*  04/15/18 5' 5.28" (1.658 m) (7 %, Z= -1.49)*  09/10/17 5' 4.76" (1.645 m) (5 %, Z= -1.62)*   * Growth percentiles are based on CDC (Boys, 2-20 Years) data.   Wt Readings from Last 3 Encounters:  04/14/19 170 lb (77.1 kg) (71 %, Z= 0.56)*  10/13/18 167 lb 6.4 oz (75.9 kg) (70 %, Z= 0.53)*  04/15/18 153 lb (69.4 kg) (53 %, Z= 0.07)*   * Growth percentiles are based on CDC (Boys, 2-20 Years) data.   HC Readings from Last 3 Encounters:  No data found for [facility]   There is no height or weight on file to calculate BSA. Facility age limit for growth %iles is 20 years. Facility age limit for growth %iles is 20 years.    PHYSICAL EXAM:  General: Well developed, well nourished male in no acute distress.   Head: Normocephalic, atraumatic.   Eyes:  Pupils equal and round. EOMI.  Sclera white.  No eye drainage.   Cardiovascular: No cyanosis.  Respiratory: No increased work of breathing.  Skin:  No rash or lesions. Neurologic: alert and oriented, normal speech, no tremor       Assessment and Plan:  Assessment  ASSESSMENT:   1. Short stature- Normal growth per report. Has completed puberty and is now at adult height.  2. Thyroid- Clinically euthyroid on levothyroxine. WIll order labs today.    PLAN:  1. Diagnostic:TSH, FT4 and T4 ordered to [facility]  2. Therapeutic: 37.5 mcg of levothyroxine per day  3. Patient education: Discussed all of the above in detail and answered questions. Refer to adult endocrinology. Advised that he will be given 6 months worth of refills to have time to establish care. He prefers to try to find endocrinologist in [place] and will have records transferred.  4. Follow-up: None      Gretchen Short,  FNP-C  Pediatric Specialist  [address]  [place], [address]  Tele: [phone]

## 2022-01-10 DIAGNOSIS — F429 Obsessive-compulsive disorder, unspecified: Secondary | ICD-10-CM | POA: Diagnosis not present

## 2022-01-24 DIAGNOSIS — F429 Obsessive-compulsive disorder, unspecified: Secondary | ICD-10-CM | POA: Diagnosis not present

## 2022-02-07 DIAGNOSIS — F429 Obsessive-compulsive disorder, unspecified: Secondary | ICD-10-CM | POA: Diagnosis not present

## 2022-02-11 DIAGNOSIS — J029 Acute pharyngitis, unspecified: Secondary | ICD-10-CM | POA: Diagnosis not present

## 2022-02-11 DIAGNOSIS — R0989 Other specified symptoms and signs involving the circulatory and respiratory systems: Secondary | ICD-10-CM | POA: Diagnosis not present

## 2022-02-11 DIAGNOSIS — Z20822 Contact with and (suspected) exposure to covid-19: Secondary | ICD-10-CM | POA: Diagnosis not present

## 2022-02-28 DIAGNOSIS — F429 Obsessive-compulsive disorder, unspecified: Secondary | ICD-10-CM | POA: Diagnosis not present

## 2022-03-06 DIAGNOSIS — F429 Obsessive-compulsive disorder, unspecified: Secondary | ICD-10-CM | POA: Diagnosis not present

## 2022-03-06 DIAGNOSIS — F411 Generalized anxiety disorder: Secondary | ICD-10-CM | POA: Diagnosis not present

## 2022-03-07 DIAGNOSIS — F429 Obsessive-compulsive disorder, unspecified: Secondary | ICD-10-CM | POA: Diagnosis not present

## 2022-03-12 DIAGNOSIS — S50811A Abrasion of right forearm, initial encounter: Secondary | ICD-10-CM | POA: Diagnosis not present

## 2022-03-12 DIAGNOSIS — S42024A Nondisplaced fracture of shaft of right clavicle, initial encounter for closed fracture: Secondary | ICD-10-CM | POA: Diagnosis not present

## 2022-03-21 DIAGNOSIS — F4310 Post-traumatic stress disorder, unspecified: Secondary | ICD-10-CM | POA: Diagnosis not present

## 2022-03-29 DIAGNOSIS — S42031A Displaced fracture of lateral end of right clavicle, initial encounter for closed fracture: Secondary | ICD-10-CM | POA: Diagnosis not present

## 2022-04-04 DIAGNOSIS — F4310 Post-traumatic stress disorder, unspecified: Secondary | ICD-10-CM | POA: Diagnosis not present

## 2022-04-11 DIAGNOSIS — F4310 Post-traumatic stress disorder, unspecified: Secondary | ICD-10-CM | POA: Diagnosis not present

## 2022-04-25 DIAGNOSIS — F4310 Post-traumatic stress disorder, unspecified: Secondary | ICD-10-CM | POA: Diagnosis not present

## 2022-05-07 DIAGNOSIS — M25511 Pain in right shoulder: Secondary | ICD-10-CM | POA: Diagnosis not present

## 2022-05-08 DIAGNOSIS — F4310 Post-traumatic stress disorder, unspecified: Secondary | ICD-10-CM | POA: Diagnosis not present

## 2022-05-22 DIAGNOSIS — F4310 Post-traumatic stress disorder, unspecified: Secondary | ICD-10-CM | POA: Diagnosis not present

## 2022-06-05 DIAGNOSIS — F4310 Post-traumatic stress disorder, unspecified: Secondary | ICD-10-CM | POA: Diagnosis not present

## 2022-06-25 ENCOUNTER — Other Ambulatory Visit (INDEPENDENT_AMBULATORY_CARE_PROVIDER_SITE_OTHER): Payer: Self-pay | Admitting: Family

## 2022-06-25 DIAGNOSIS — E063 Autoimmune thyroiditis: Secondary | ICD-10-CM

## 2022-07-04 ENCOUNTER — Encounter (INDEPENDENT_AMBULATORY_CARE_PROVIDER_SITE_OTHER): Payer: Self-pay

## 2022-07-04 ENCOUNTER — Other Ambulatory Visit (INDEPENDENT_AMBULATORY_CARE_PROVIDER_SITE_OTHER): Payer: Self-pay | Admitting: Family

## 2022-07-04 DIAGNOSIS — E063 Autoimmune thyroiditis: Secondary | ICD-10-CM

## 2022-07-04 NOTE — Telephone Encounter (Signed)
  Name of who is calling: Kerrion Kemppainen  Caller's Relationship to Patient:   Best contact number: [phone]  Provider they see: Hedda Slade  Reason for call: Legrand Como called due to [facility] stating that they were not able to refill his medication and they did not specify why. Trimaine is also in the process of transferring to an endocrinologist in [place] and would like to know if there is any paper work that needs to be transferred over.     PRESCRIPTION REFILL ONLY  Name of prescription: Levothyroxine   Pharmacy: [facility] [address] [place], [place]

## 2022-07-04 NOTE — Telephone Encounter (Signed)
Called patient. He has chosen an office:  [facility] Fax Number (for referrals/records) per patient: [phone]  Ambulatory Referral order sent on behalf of patient to help transition care and records being sent.  Patient also requests (more) Rx's while awaiting records being reviewed and upcoming appointments.  B. Roten CMA

## 2022-07-05 NOTE — Addendum Note (Signed)
Addended by: Drucilla Chalet on: 07/05/2022 07:56 AM   Modules accepted: Orders

## 2022-07-05 NOTE — Telephone Encounter (Signed)
Called patient to let him know to call (New) Endocrinology office and make appointment since records are sent. Once appt is made re-contact office and refill can be sent up to that date.  Patient agreed.  He will then notify us by phone or MyChart of new appointment date.  Note filed until response.  B. Roten CMA

## 2022-07-09 DIAGNOSIS — M25511 Pain in right shoulder: Secondary | ICD-10-CM | POA: Diagnosis not present

## 2022-07-09 NOTE — Telephone Encounter (Signed)
Notified patient.  Patient will call us when appointment with new provider is made for refills (up to that appointment).   Note: No refills or authorizations pat 12-29-2022.   B. Roten CMA

## 2022-07-12 DIAGNOSIS — F4310 Post-traumatic stress disorder, unspecified: Secondary | ICD-10-CM | POA: Diagnosis not present

## 2022-07-16 ENCOUNTER — Telehealth (INDEPENDENT_AMBULATORY_CARE_PROVIDER_SITE_OTHER): Payer: Self-pay | Admitting: Family

## 2022-07-16 NOTE — Telephone Encounter (Signed)
This patient needs labs and appointment for further refills. I called and lv for them to call back .

## 2022-07-16 NOTE — Telephone Encounter (Signed)
  Name of who is calling: Luis Robertson  Caller's Relationship to Patient: self  Best contact number: [phone]  Provider they see: Hedda Slade  Reason for call: needs a refill      PRESCRIPTION REFILL ONLY  Name of prescription:levothyroxine (SYNTHROID) 75 MCG tablet   [id number] [place], [place].

## 2022-07-26 DIAGNOSIS — F4310 Post-traumatic stress disorder, unspecified: Secondary | ICD-10-CM | POA: Diagnosis not present

## 2022-08-09 DIAGNOSIS — F4310 Post-traumatic stress disorder, unspecified: Secondary | ICD-10-CM | POA: Diagnosis not present

## 2022-08-16 ENCOUNTER — Telehealth (INDEPENDENT_AMBULATORY_CARE_PROVIDER_SITE_OTHER): Payer: Self-pay | Admitting: Family

## 2022-08-16 DIAGNOSIS — E039 Hypothyroidism, unspecified: Secondary | ICD-10-CM | POA: Diagnosis not present

## 2022-08-16 NOTE — Telephone Encounter (Signed)
  Name of who is calling: Micheal Cavazos  Caller's Relationship to Patient: Self  Best contact number: [phone]  Provider they see: Cherly Anderson  Reason for call: Keiondre has called in stating that he was told once he was established with an adult Endo he could call in the get Rx refill for levothyroxine. He is scheduled for 12/11/22.     PRESCRIPTION REFILL ONLY  Name of prescription:  Pharmacy:

## 2022-08-16 NOTE — Telephone Encounter (Signed)
Spoke with patient. I explained that the appointment was needed for his new endocrinologist and for them to fill it. I also explained that we can not legally send in any medication for him. He has it been see in the office in over 3 years. He has to be seen at least once a year. I told him that he could contact his primary and if he doesn't have a primary then he can go to urgent care to have them refill it until his appointment. I asked him who he has his appointment with in August, he stated that he doesn't remember the name of the office.Marland KitchenMarland KitchenMarland Kitchen

## 2022-08-17 ENCOUNTER — Telehealth (INDEPENDENT_AMBULATORY_CARE_PROVIDER_SITE_OTHER): Payer: Self-pay

## 2022-08-17 DIAGNOSIS — E063 Autoimmune thyroiditis: Secondary | ICD-10-CM

## 2022-08-17 NOTE — Telephone Encounter (Signed)
Spoke with [facility] [employer] regarding patients medication. He said that if the patient went and got labs he would send him in enough medication to get him through to his appointment in August.  I called [facility] to let him know. He asked that labs be sent to [facility]. I have put in the labs and released them. I asked him to let us know when he gets his lab work done so we can keep an eye out for the results. I explained to the patient that he has to keep his appointment with his new provider, this will be the last refill we are able to do. Patient agrees with plan.

## 2022-08-23 DIAGNOSIS — F4310 Post-traumatic stress disorder, unspecified: Secondary | ICD-10-CM | POA: Diagnosis not present

## 2022-09-06 DIAGNOSIS — F4310 Post-traumatic stress disorder, unspecified: Secondary | ICD-10-CM | POA: Diagnosis not present

## 2022-09-13 DIAGNOSIS — F4310 Post-traumatic stress disorder, unspecified: Secondary | ICD-10-CM | POA: Diagnosis not present

## 2022-09-27 DIAGNOSIS — F4310 Post-traumatic stress disorder, unspecified: Secondary | ICD-10-CM | POA: Diagnosis not present

## 2022-10-11 DIAGNOSIS — F4310 Post-traumatic stress disorder, unspecified: Secondary | ICD-10-CM | POA: Diagnosis not present

## 2022-11-01 DIAGNOSIS — F4310 Post-traumatic stress disorder, unspecified: Secondary | ICD-10-CM | POA: Diagnosis not present

## 2022-11-15 DIAGNOSIS — F4310 Post-traumatic stress disorder, unspecified: Secondary | ICD-10-CM | POA: Diagnosis not present

## 2022-11-29 DIAGNOSIS — F4310 Post-traumatic stress disorder, unspecified: Secondary | ICD-10-CM | POA: Diagnosis not present

## 2022-12-06 DIAGNOSIS — F4310 Post-traumatic stress disorder, unspecified: Secondary | ICD-10-CM | POA: Diagnosis not present

## 2022-12-11 DIAGNOSIS — E039 Hypothyroidism, unspecified: Secondary | ICD-10-CM | POA: Diagnosis not present

## 2022-12-13 DIAGNOSIS — F4310 Post-traumatic stress disorder, unspecified: Secondary | ICD-10-CM | POA: Diagnosis not present

## 2022-12-27 DIAGNOSIS — F4310 Post-traumatic stress disorder, unspecified: Secondary | ICD-10-CM | POA: Diagnosis not present

## 2023-01-10 DIAGNOSIS — F4310 Post-traumatic stress disorder, unspecified: Secondary | ICD-10-CM | POA: Diagnosis not present

## 2023-01-23 DIAGNOSIS — F429 Obsessive-compulsive disorder, unspecified: Secondary | ICD-10-CM | POA: Diagnosis not present

## 2023-01-23 DIAGNOSIS — F411 Generalized anxiety disorder: Secondary | ICD-10-CM | POA: Diagnosis not present

## 2023-01-23 DIAGNOSIS — F339 Major depressive disorder, recurrent, unspecified: Secondary | ICD-10-CM | POA: Diagnosis not present

## 2023-02-07 DIAGNOSIS — F4310 Post-traumatic stress disorder, unspecified: Secondary | ICD-10-CM | POA: Diagnosis not present

## 2023-02-21 DIAGNOSIS — F4310 Post-traumatic stress disorder, unspecified: Secondary | ICD-10-CM | POA: Diagnosis not present

## 2023-03-04 DIAGNOSIS — E039 Hypothyroidism, unspecified: Secondary | ICD-10-CM | POA: Diagnosis not present

## 2023-03-07 DIAGNOSIS — F4310 Post-traumatic stress disorder, unspecified: Secondary | ICD-10-CM | POA: Diagnosis not present

## 2023-03-28 DIAGNOSIS — F4310 Post-traumatic stress disorder, unspecified: Secondary | ICD-10-CM | POA: Diagnosis not present

## 2023-04-06 DIAGNOSIS — Z23 Encounter for immunization: Secondary | ICD-10-CM | POA: Diagnosis not present

## 2023-04-08 DIAGNOSIS — E039 Hypothyroidism, unspecified: Secondary | ICD-10-CM | POA: Diagnosis not present

## 2023-04-11 DIAGNOSIS — F4310 Post-traumatic stress disorder, unspecified: Secondary | ICD-10-CM | POA: Diagnosis not present

## 2023-04-25 DIAGNOSIS — F4310 Post-traumatic stress disorder, unspecified: Secondary | ICD-10-CM | POA: Diagnosis not present

## 2023-05-09 DIAGNOSIS — F4310 Post-traumatic stress disorder, unspecified: Secondary | ICD-10-CM | POA: Diagnosis not present

## 2023-05-23 DIAGNOSIS — F4310 Post-traumatic stress disorder, unspecified: Secondary | ICD-10-CM | POA: Diagnosis not present

## 2023-06-06 DIAGNOSIS — F4310 Post-traumatic stress disorder, unspecified: Secondary | ICD-10-CM | POA: Diagnosis not present

## 2023-06-20 DIAGNOSIS — F4310 Post-traumatic stress disorder, unspecified: Secondary | ICD-10-CM | POA: Diagnosis not present

## 2023-06-30 DIAGNOSIS — L309 Dermatitis, unspecified: Secondary | ICD-10-CM | POA: Diagnosis not present

## 2023-06-30 DIAGNOSIS — R21 Rash and other nonspecific skin eruption: Secondary | ICD-10-CM | POA: Diagnosis not present

## 2023-07-04 DIAGNOSIS — F4310 Post-traumatic stress disorder, unspecified: Secondary | ICD-10-CM | POA: Diagnosis not present

## 2023-07-18 DIAGNOSIS — F4310 Post-traumatic stress disorder, unspecified: Secondary | ICD-10-CM | POA: Diagnosis not present

## 2023-07-30 ENCOUNTER — Encounter (INDEPENDENT_AMBULATORY_CARE_PROVIDER_SITE_OTHER): Payer: Self-pay

## 2023-08-01 DIAGNOSIS — F4310 Post-traumatic stress disorder, unspecified: Secondary | ICD-10-CM | POA: Diagnosis not present

## 2023-08-12 ENCOUNTER — Encounter (INDEPENDENT_AMBULATORY_CARE_PROVIDER_SITE_OTHER): Payer: Self-pay

## 2023-08-15 DIAGNOSIS — F4310 Post-traumatic stress disorder, unspecified: Secondary | ICD-10-CM | POA: Diagnosis not present

## 2023-08-29 DIAGNOSIS — F4310 Post-traumatic stress disorder, unspecified: Secondary | ICD-10-CM | POA: Diagnosis not present

## 2023-09-12 DIAGNOSIS — F4310 Post-traumatic stress disorder, unspecified: Secondary | ICD-10-CM | POA: Diagnosis not present

## 2023-09-26 DIAGNOSIS — F4310 Post-traumatic stress disorder, unspecified: Secondary | ICD-10-CM | POA: Diagnosis not present

## 2023-10-10 DIAGNOSIS — F4310 Post-traumatic stress disorder, unspecified: Secondary | ICD-10-CM | POA: Diagnosis not present

## 2023-10-14 DIAGNOSIS — F4310 Post-traumatic stress disorder, unspecified: Secondary | ICD-10-CM | POA: Diagnosis not present

## 2023-10-28 DIAGNOSIS — F4310 Post-traumatic stress disorder, unspecified: Secondary | ICD-10-CM | POA: Diagnosis not present

## 2023-11-11 DIAGNOSIS — F4310 Post-traumatic stress disorder, unspecified: Secondary | ICD-10-CM | POA: Diagnosis not present

## 2023-11-13 DIAGNOSIS — F429 Obsessive-compulsive disorder, unspecified: Secondary | ICD-10-CM | POA: Diagnosis not present

## 2023-11-13 DIAGNOSIS — F411 Generalized anxiety disorder: Secondary | ICD-10-CM | POA: Diagnosis not present

## 2023-11-15 DIAGNOSIS — Z131 Encounter for screening for diabetes mellitus: Secondary | ICD-10-CM | POA: Diagnosis not present

## 2023-11-15 DIAGNOSIS — Z0000 Encounter for general adult medical examination without abnormal findings: Secondary | ICD-10-CM | POA: Diagnosis not present

## 2023-11-15 DIAGNOSIS — Z113 Encounter for screening for infections with a predominantly sexual mode of transmission: Secondary | ICD-10-CM | POA: Diagnosis not present

## 2023-11-15 DIAGNOSIS — Z136 Encounter for screening for cardiovascular disorders: Secondary | ICD-10-CM | POA: Diagnosis not present

## 2023-11-15 DIAGNOSIS — Z1159 Encounter for screening for other viral diseases: Secondary | ICD-10-CM | POA: Diagnosis not present

## 2023-11-25 DIAGNOSIS — F4310 Post-traumatic stress disorder, unspecified: Secondary | ICD-10-CM | POA: Diagnosis not present

## 2023-12-09 DIAGNOSIS — F4310 Post-traumatic stress disorder, unspecified: Secondary | ICD-10-CM | POA: Diagnosis not present

## 2023-12-30 DIAGNOSIS — F4310 Post-traumatic stress disorder, unspecified: Secondary | ICD-10-CM | POA: Diagnosis not present

## 2024-02-03 DIAGNOSIS — F4310 Post-traumatic stress disorder, unspecified: Secondary | ICD-10-CM | POA: Diagnosis not present

## 2024-02-10 DIAGNOSIS — F4310 Post-traumatic stress disorder, unspecified: Secondary | ICD-10-CM | POA: Diagnosis not present

## 2024-02-17 DIAGNOSIS — F4310 Post-traumatic stress disorder, unspecified: Secondary | ICD-10-CM | POA: Diagnosis not present

## 2024-03-02 DIAGNOSIS — F4310 Post-traumatic stress disorder, unspecified: Secondary | ICD-10-CM | POA: Diagnosis not present

## 2024-03-16 DIAGNOSIS — F4310 Post-traumatic stress disorder, unspecified: Secondary | ICD-10-CM | POA: Diagnosis not present

## 2024-03-30 DIAGNOSIS — F4310 Post-traumatic stress disorder, unspecified: Secondary | ICD-10-CM | POA: Diagnosis not present
